# Patient Record
Sex: Female | Born: 1985 | Race: White | Hispanic: No | Marital: Single | Smoking: Current every day smoker
Health system: Southern US, Community
[De-identification: ages and names within clinical notes are randomized; demographics above are authoritative.]

## PROBLEM LIST (undated history)

## (undated) DIAGNOSIS — I10 Essential (primary) hypertension: Secondary | ICD-10-CM

---
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
## 2007-02-18 ENCOUNTER — Ambulatory Visit (HOSPITAL_COMMUNITY): Admission: RE | Admit: 2007-02-18 | Discharge: 2007-02-18 | Payer: Self-pay | Admitting: Internal Medicine

## 2008-09-20 ENCOUNTER — Ambulatory Visit (HOSPITAL_COMMUNITY): Admission: RE | Admit: 2008-09-20 | Discharge: 2008-09-20 | Payer: Self-pay | Admitting: Obstetrics

## 2008-12-12 ENCOUNTER — Ambulatory Visit (HOSPITAL_COMMUNITY): Admission: RE | Admit: 2008-12-12 | Discharge: 2008-12-12 | Payer: Self-pay | Admitting: Obstetrics

## 2009-02-05 ENCOUNTER — Encounter: Payer: Self-pay | Admitting: Obstetrics

## 2009-02-05 ENCOUNTER — Inpatient Hospital Stay (HOSPITAL_COMMUNITY): Admission: RE | Admit: 2009-02-05 | Discharge: 2009-02-08 | Payer: Self-pay | Admitting: Obstetrics

## 2010-05-19 LAB — CBC
Hemoglobin: 9.1 g/dL — ABNORMAL LOW (ref 12.0–15.0)
MCHC: 34.5 g/dL (ref 30.0–36.0)
MCV: 88.9 fL (ref 78.0–100.0)
Platelets: 260 10*3/uL (ref 150–400)
Platelets: 323 10*3/uL (ref 150–400)
RBC: 2.96 MIL/uL — ABNORMAL LOW (ref 3.87–5.11)
RDW: 13.6 % (ref 11.5–15.5)
WBC: 8.5 10*3/uL (ref 4.0–10.5)

## 2010-05-19 LAB — RPR: RPR Ser Ql: NONREACTIVE

## 2011-05-24 IMAGING — US US OB DETAIL+14 WK
3 series · 14 of 28 positions shown · non-contrast
Comparison: none

OBSTETRICAL ULTRASOUND:
 This ultrasound exam was performed in the [HOSPITAL] Ultrasound Department.  The OB US report was generated in the AS system, and faxed to the ordering physician.  This report is also available in [REDACTED] PACS.

[Series 1: us ob detail +14 wk · 0.19mm/px · 1 of 4 slices shown (1 of 3)]
[im 4/4]
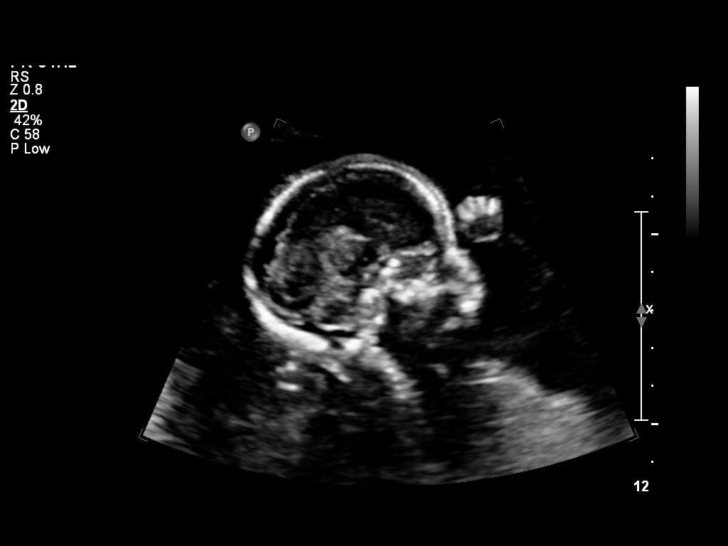

[Series 1: us ob detail +14 wk · 0.24mm/px · 11 of 43 slices shown (2 of 3)]
[im 3/43]
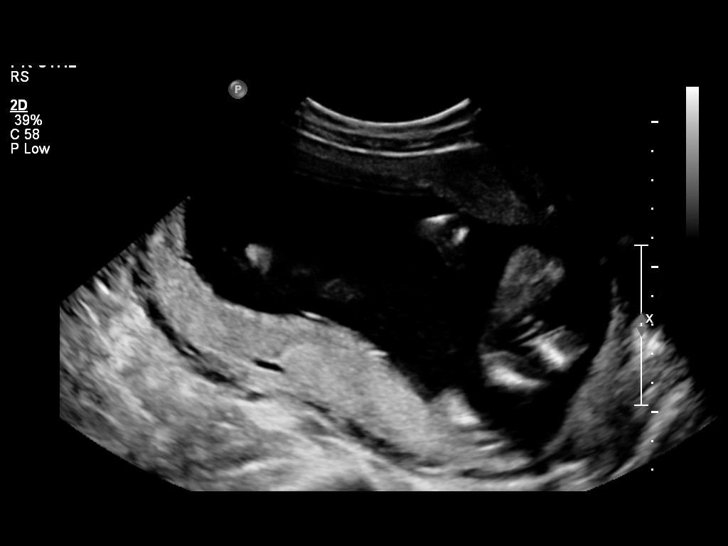
[im 7/43]
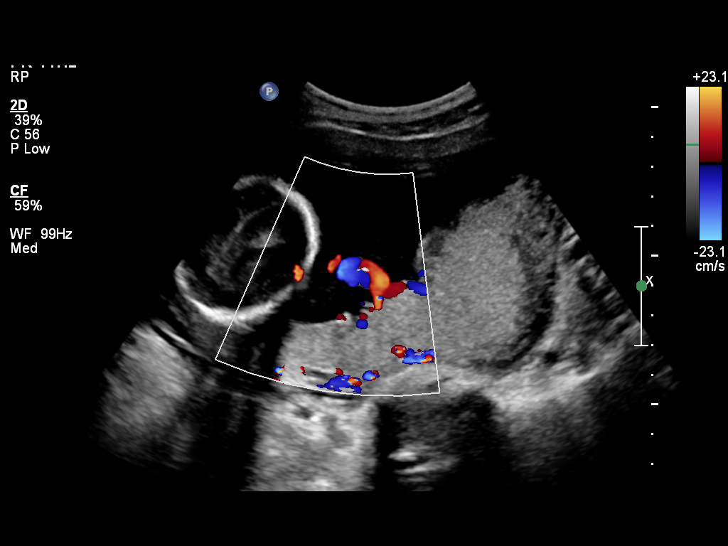
[im 11/43]
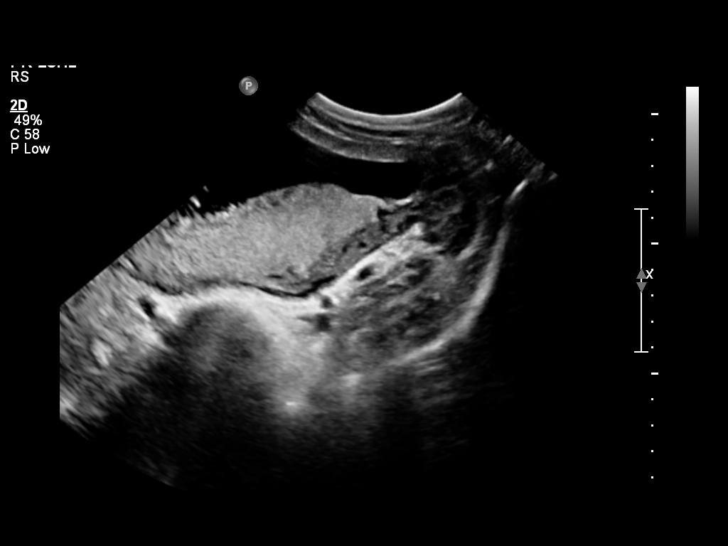
[im 15/43]
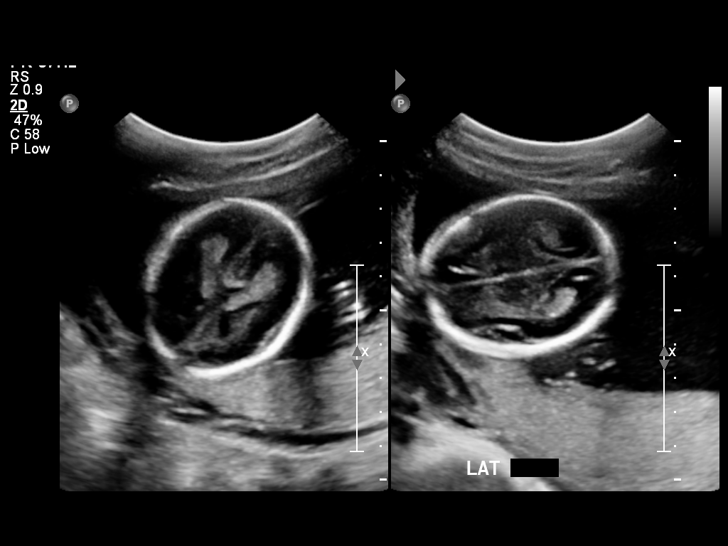
[im 19/43]
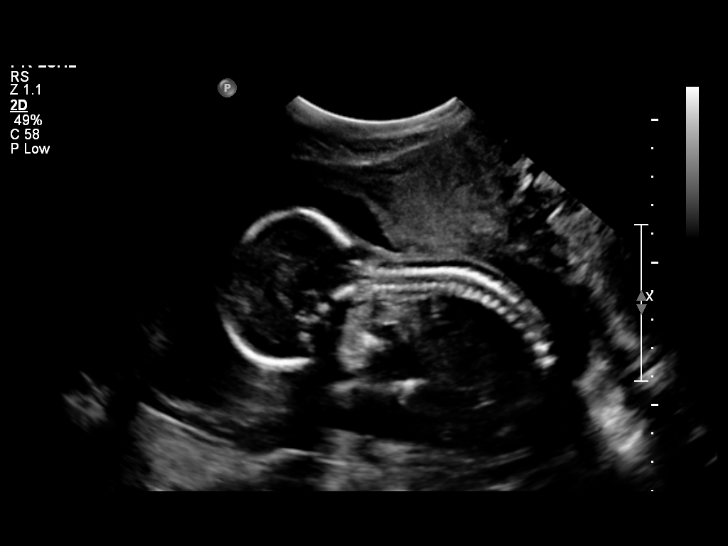
[im 23/43]
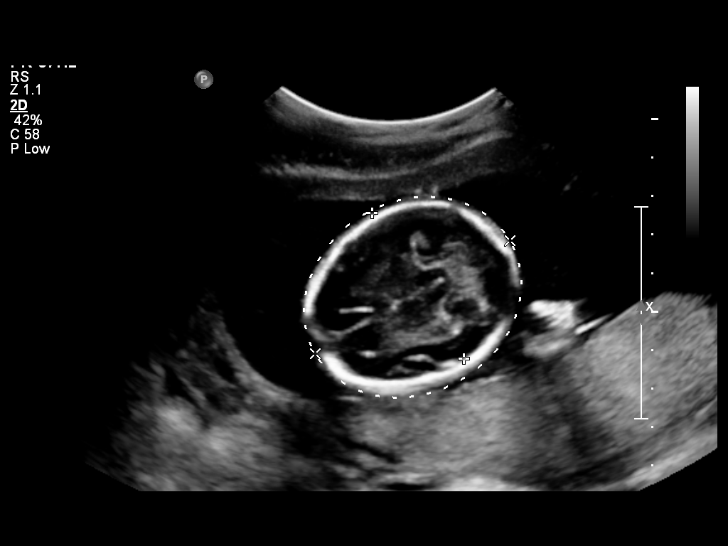
[im 27/43]
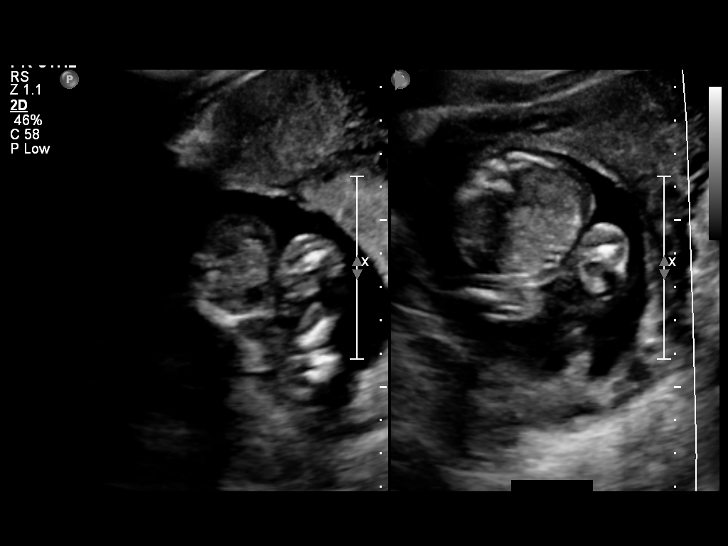
[im 31/43]
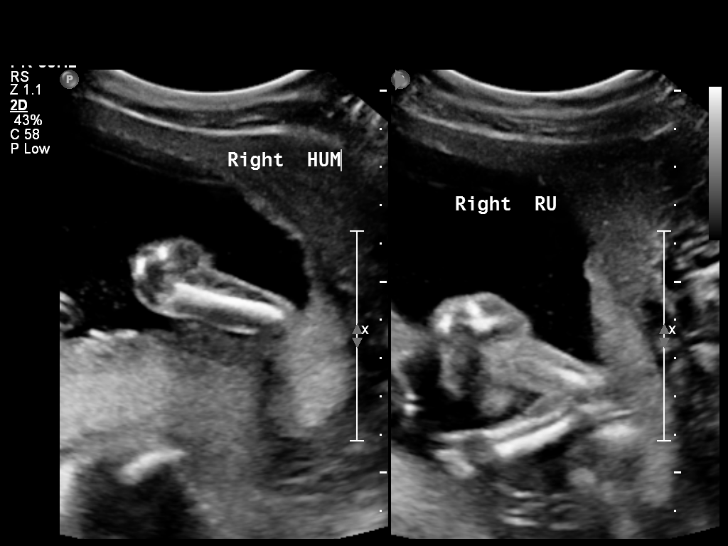
[im 35/43]
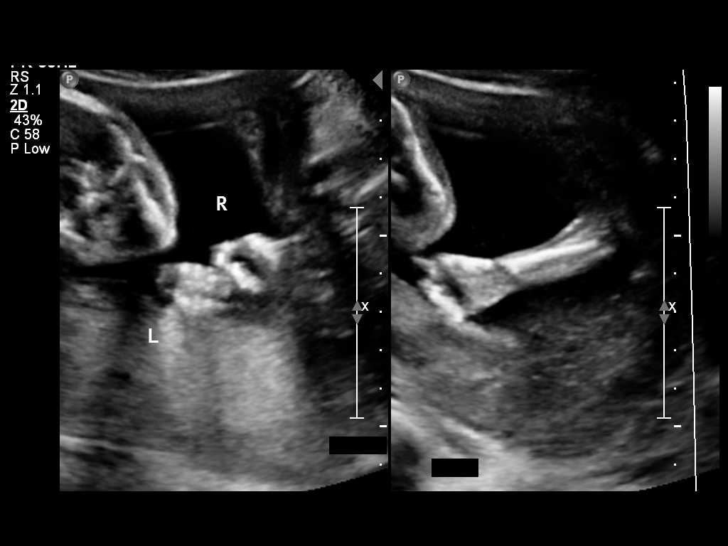
[im 39/43]
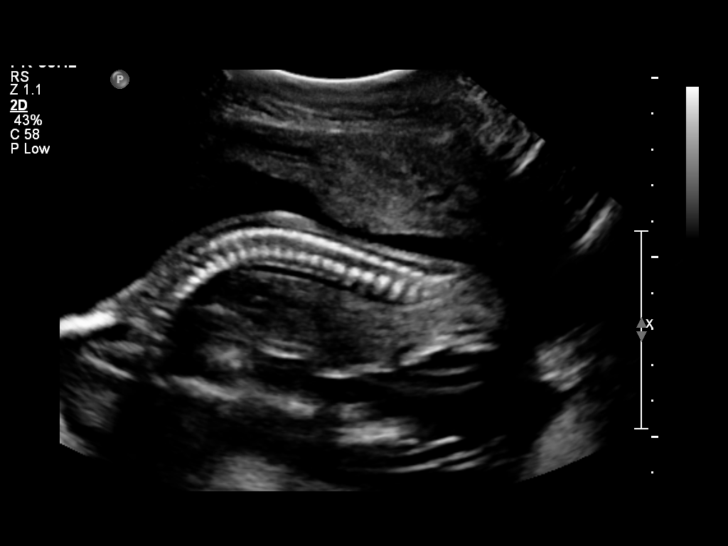
[im 43/43]
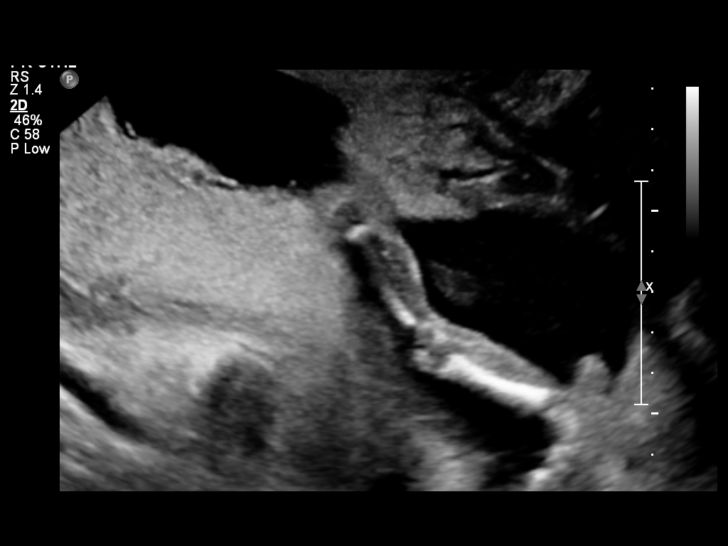

[Series 1: us ob detail +14 wk · 0.17mm/px · 2 of 9 slices shown (3 of 3)]
[im 3/9]
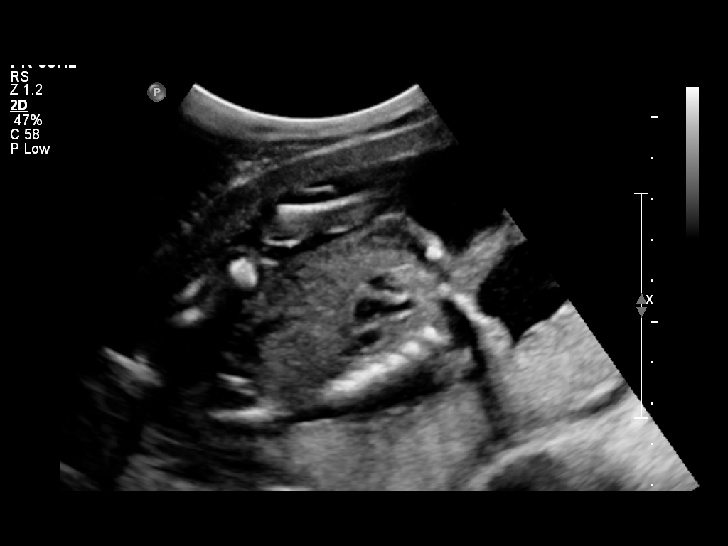
[im 9/9]
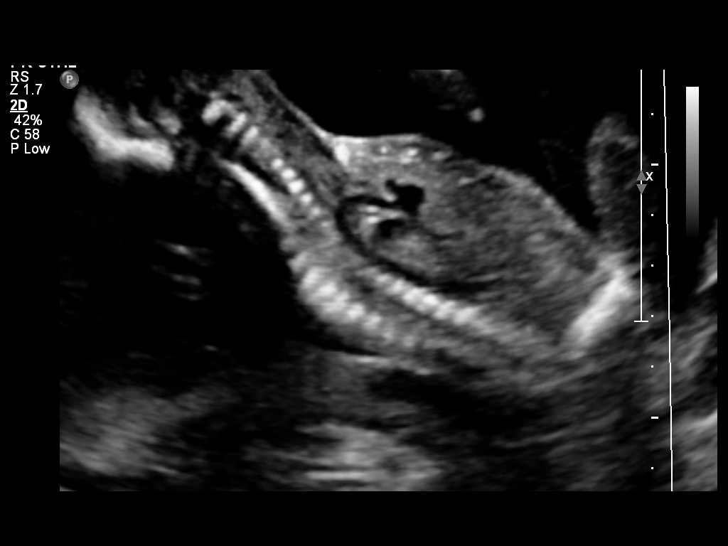

[14 of 28 positions shown; findings below may reference images not displayed]

IMPRESSION: See AS Obstetric US report.

## 2011-09-22 ENCOUNTER — Emergency Department (HOSPITAL_COMMUNITY)
Admission: EM | Admit: 2011-09-22 | Discharge: 2011-09-22 | Discharge status: Against Medical Advice | Payer: Medicaid Other | Attending: Emergency Medicine | Admitting: Emergency Medicine

## 2011-09-22 ENCOUNTER — Encounter (HOSPITAL_COMMUNITY): Payer: Self-pay | Admitting: *Deleted

## 2011-09-22 DIAGNOSIS — R1110 Vomiting, unspecified: Secondary | ICD-10-CM | POA: Insufficient documentation

## 2011-09-22 DIAGNOSIS — R109 Unspecified abdominal pain: Secondary | ICD-10-CM | POA: Insufficient documentation

## 2011-09-22 LAB — URINALYSIS, ROUTINE W REFLEX MICROSCOPIC
Glucose, UA: NEGATIVE mg/dL
Hgb urine dipstick: NEGATIVE
Protein, ur: NEGATIVE mg/dL
Specific Gravity, Urine: 1.027 (ref 1.005–1.030)
Urobilinogen, UA: 0.2 mg/dL (ref 0.0–1.0)
pH: 6 (ref 5.0–8.0)

## 2011-09-22 LAB — POCT PREGNANCY, URINE: Preg Test, Ur: NEGATIVE

## 2011-09-22 NOTE — ED Notes (Signed)
To ED for abd pain, belching, and vomiting. Pt states she has had this happen since she was little. States she usually just vomits for approx 12 hrs and then it resolves. No cp.

## 2011-09-22 NOTE — ED Notes (Signed)
The pt feel like she cannot wait any longer,.  She reports that she cannot stay here in the waiting room vomiting

## 2011-11-05 ENCOUNTER — Emergency Department (HOSPITAL_COMMUNITY)
Admission: EM | Admit: 2011-11-05 | Discharge: 2011-11-05 | Disposition: A | Discharge status: Home / Self Care | Payer: Medicaid Other | Attending: Emergency Medicine | Admitting: Emergency Medicine

## 2011-11-05 ENCOUNTER — Encounter (HOSPITAL_COMMUNITY): Payer: Self-pay | Admitting: *Deleted

## 2011-11-05 DIAGNOSIS — T24239A Burn of second degree of unspecified lower leg, initial encounter: Secondary | ICD-10-CM | POA: Insufficient documentation

## 2011-11-05 DIAGNOSIS — X19XXXA Contact with other heat and hot substances, initial encounter: Secondary | ICD-10-CM | POA: Insufficient documentation

## 2011-11-05 MED ORDER — BACITRACIN ZINC 500 UNIT/GM EX OINT
TOPICAL_OINTMENT | CUTANEOUS | Status: AC
  Filled 2011-11-05: qty 0.9

## 2011-11-05 MED ORDER — SILVER SULFADIAZINE 1 % EX CREA: TOPICAL_CREAM | Freq: Once | CUTANEOUS | Status: DC

## 2011-11-05 MED ORDER — BACITRACIN ZINC 500 UNIT/GM EX OINT
1.0000 "application " | TOPICAL_OINTMENT | Freq: Two times a day (BID) | CUTANEOUS | Status: DC
  Administered 2011-11-05: 1 via TOPICAL
  Filled 2011-11-05: qty 15
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
## 2011-11-05 NOTE — ED Notes (Signed)
Discharge instructions reviewed with pt, questions answered. Pt verbalized understanding.  
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
## 2011-11-05 NOTE — ED Notes (Signed)
Wound already cleaned, applied bacitracin and non stick bandage, provided pt instruction on wound care.

## 2011-11-05 NOTE — ED Provider Notes (Signed)
Medical screening examination/treatment/procedure(s) were performed by non-physician practitioner and as supervising physician I was immediately available for consultation/collaboration.  Derwood Kaplan, MD 11/05/11 2316

## 2012-03-11 ENCOUNTER — Encounter (HOSPITAL_COMMUNITY): Payer: Self-pay | Admitting: *Deleted

## 2012-03-11 ENCOUNTER — Emergency Department (HOSPITAL_COMMUNITY)
Admission: EM | Admit: 2012-03-11 | Discharge: 2012-03-11 | Disposition: A | Discharge status: Home / Self Care | Payer: MEDICAID | Attending: Emergency Medicine | Admitting: Emergency Medicine

## 2012-03-11 DIAGNOSIS — Z3202 Encounter for pregnancy test, result negative: Secondary | ICD-10-CM | POA: Insufficient documentation

## 2012-03-11 DIAGNOSIS — F17200 Nicotine dependence, unspecified, uncomplicated: Secondary | ICD-10-CM | POA: Insufficient documentation

## 2012-03-11 DIAGNOSIS — F1910 Other psychoactive substance abuse, uncomplicated: Secondary | ICD-10-CM

## 2012-03-11 LAB — CBC WITH DIFFERENTIAL/PLATELET
Basophils Relative: 1 % (ref 0–1)
Eosinophils Absolute: 0.1 10*3/uL (ref 0.0–0.7)
HCT: 39.7 % (ref 36.0–46.0)
Lymphocytes Relative: 32 % (ref 12–46)
Monocytes Absolute: 0.3 10*3/uL (ref 0.1–1.0)
Neutro Abs: 3.1 10*3/uL (ref 1.7–7.7)
RBC: 4.55 MIL/uL (ref 3.87–5.11)
RDW: 13.1 % (ref 11.5–15.5)

## 2012-03-11 LAB — URINALYSIS, ROUTINE W REFLEX MICROSCOPIC
Bilirubin Urine: NEGATIVE
Specific Gravity, Urine: 1.02 (ref 1.005–1.030)
Urobilinogen, UA: 0.2 mg/dL (ref 0.0–1.0)

## 2012-03-11 LAB — BASIC METABOLIC PANEL
BUN: 15 mg/dL (ref 6–23)
Chloride: 102 mEq/L (ref 96–112)
Creatinine, Ser: 0.72 mg/dL (ref 0.50–1.10)
GFR calc non Af Amer: >90 mL/min (ref >90)
Potassium: 3.9 mEq/L (ref 3.5–5.1)
Sodium: 140 mEq/L (ref 135–145)

## 2012-03-11 LAB — URINE MICROSCOPIC-ADD ON

## 2012-03-11 LAB — ETHANOL: Alcohol, Ethyl (B): <11 mg/dL (ref 0–11)

## 2012-03-11 LAB — RAPID URINE DRUG SCREEN, HOSP PERFORMED
Amphetamines: NOT DETECTED
Barbiturates: NOT DETECTED
Cocaine: NOT DETECTED

## 2012-03-11 NOTE — ED Notes (Addendum)
Pt states that she wants help to get off adderall , denies SI or HI, denies any attempt for suicide, pt also denies ETOH or drug use

## 2012-03-11 NOTE — ED Notes (Signed)
Family at bedside. 

## 2012-03-11 NOTE — ED Notes (Signed)
Pt has changed into paper scrubs and security called and to wand pt

## 2012-03-11 NOTE — ED Notes (Signed)
Pt left room, informed pt that waiting for d/c instructions, pt and pt's mother continued to leave without d/c papers

## 2012-03-11 NOTE — ED Provider Notes (Signed)
History   This chart was scribed for Flint Melter, MD, by Frederik Pear, ER scribe. The patient was seen in room APA16A/APA16A and the patient's care was started at 1520.    CSN: [id number]  Arrival date & time 03/11/12  1504   First MD Initiated Contact with Patient 03/11/12 1520      Chief Complaint  Patient presents with  . V70.1    (Consider location/radiation/quality/duration/timing/severity/associated sxs/prior treatment) HPI  Katherine Mendoza is a 27 y.o. female who presents to the Emergency Department stating that she wants helping getting treatment to stop taking adderall. She states that she feels committed to stopping, but has been unable to do so on her own. She reports that for the past 7 years that has used anywhere from 0 to 100 mg a day 5 out of 7 days of the week. She also reports that she occasionally uses marijuana and xanax once every couple of weeks. She denies any other current symptoms nausea or emesis. Her mother reports that typically gets violent, belligerent, and exhibits SI if she goes more than 3 days without taking it. When asked, she denies any current SI or having a plan. She denies having a current PCP, therapist or psychiatrist, but has an appointment scheduled for 02/27 after having been at [facility] today and yesterday. She denies any ETOH abuse. She reports her last menstrual cycle was at the beginning of Jan and that she currently is not pregnant.  History reviewed. No pertinent past medical history.  Past Surgical History  Procedure Date  . Cesarean section     History reviewed. No pertinent family history.  History  Substance Use Topics  . Smoking status: Current Every Day Smoker  . Smokeless tobacco: Not on file  . Alcohol Use: No    OB History    Grav Para Term Preterm Abortions TAB SAB Ect Mult Living                  Review of Systems A complete 10 system review of systems was obtained and all systems are negative except as  noted in the HPI and PMH.   Allergies  Penicillins and Sulfa antibiotics  Home Medications   Current Outpatient Rx  Name  Route  Sig  Dispense  Refill  . IBUPROFEN 200 MG PO TABS   Oral   Take 400 mg by mouth once as needed. For pain           BP 133/88  Pulse 101  Temp 99.1 F (37.3 C) (Oral)  Resp 18  Ht 5\' 6"  (1.676 m)  Wt 115 lb (52.164 kg)  BMI 18.56 kg/m2  SpO2 100%  LMP 02/17/2012  Physical Exam  Nursing note and vitals reviewed. Constitutional: She is oriented to person, place, and time. She appears well-developed and well-nourished.  HENT:  Head: Normocephalic and atraumatic.  Eyes: Conjunctivae normal and EOM are normal. Pupils are equal, round, and reactive to light.  Neck: Normal range of motion and phonation normal. Neck supple.  Cardiovascular: Normal rate, regular rhythm and intact distal pulses.   Pulmonary/Chest: Effort normal and breath sounds normal. She exhibits no tenderness.  Abdominal: Soft. She exhibits no distension. There is no tenderness. There is no guarding.  Musculoskeletal: Normal range of motion.  Neurological: She is alert and oriented to person, place, and time. She has normal strength. She exhibits normal muscle tone.  Skin: Skin is warm and dry.  Psychiatric: She has a normal mood and  affect. Her behavior is normal. Judgment and thought content normal.    ED Course  Procedures (including critical care time)  DIAGNOSTIC STUDIES: Oxygen Saturation is 100% on room air, normal by my interpretation.    COORDINATION OF CARE:  16:33- Discussed planned course of treatment with the patient, including a UA, blood work, and drug screen, who is agreeable at this time.  17:09- Recheck- Discussed with pt after speaking with Samson Frederic from [facility] that she does not qualify for inpatient treatment.  Results for orders placed during the hospital encounter of 03/11/12  URINALYSIS, ROUTINE W REFLEX MICROSCOPIC      Component Value Range   Color,  Urine YELLOW  YELLOW   APPearance CLEAR  CLEAR   Specific Gravity, Urine 1.020  1.005 - 1.030   pH 7.0  5.0 - 8.0   Glucose, UA NEGATIVE  NEGATIVE mg/dL   Hgb urine dipstick SMALL (*) NEGATIVE   Bilirubin Urine NEGATIVE  NEGATIVE   Ketones, ur NEGATIVE  NEGATIVE mg/dL   Protein, ur NEGATIVE  NEGATIVE mg/dL   Urobilinogen, UA 0.2  0.0 - 1.0 mg/dL   Nitrite NEGATIVE  NEGATIVE   Leukocytes, UA SMALL (*) NEGATIVE  PREGNANCY, URINE      Component Value Range   Preg Test, Ur NEGATIVE  NEGATIVE  URINE RAPID DRUG SCREEN (HOSP PERFORMED)      Component Value Range   Opiates NONE DETECTED  NONE DETECTED   Cocaine NONE DETECTED  NONE DETECTED   Benzodiazepines NONE DETECTED  NONE DETECTED   Amphetamines NONE DETECTED  NONE DETECTED   Tetrahydrocannabinol POSITIVE (*) NONE DETECTED   Barbiturates NONE DETECTED  NONE DETECTED  CBC WITH DIFFERENTIAL      Component Value Range   WBC 5.1  4.0 - 10.5 K/uL   RBC 4.55  3.87 - 5.11 MIL/uL   Hemoglobin 13.1  12.0 - 15.0 g/dL   HCT 16.1  09.6 - 04.5 %   MCV 87.3  78.0 - 100.0 fL   MCH 28.8  26.0 - 34.0 pg   MCHC 33.0  30.0 - 36.0 g/dL   RDW 40.9  81.1 - 91.4 %   Platelets 390  150 - 400 K/uL   Neutrophils Relative 61  43 - 77 %   Neutro Abs 3.1  1.7 - 7.7 K/uL   Lymphocytes Relative 32  12 - 46 %   Lymphs Abs 1.6  0.7 - 4.0 K/uL   Monocytes Relative 5  3 - 12 %   Monocytes Absolute 0.3  0.1 - 1.0 K/uL   Eosinophils Relative 1  0 - 5 %   Eosinophils Absolute 0.1  0.0 - 0.7 K/uL   Basophils Relative 1  0 - 1 %   Basophils Absolute 0.0  0.0 - 0.1 K/uL  BASIC METABOLIC PANEL      Component Value Range   Sodium 140  135 - 145 mEq/L   Potassium 3.9  3.5 - 5.1 mEq/L   Chloride 102  96 - 112 mEq/L   CO2 31  19 - 32 mEq/L   Glucose, Bld 80  70 - 99 mg/dL   BUN 15  6 - 23 mg/dL   Creatinine, Ser 7.82  0.50 - 1.10 mg/dL   Calcium 9.5  8.4 - 95.6 mg/dL   GFR calc non Af Amer >90  >90 mL/min   GFR calc Af Amer >90  >90 mL/min  ETHANOL       Component Value Range   Alcohol, Ethyl (  B) <11  0 - 11 mg/dL  URINE MICROSCOPIC-ADD ON      Component Value Range   Squamous Epithelial / LPF MANY (*) RARE   WBC, UA 3-6  <3 WBC/hpf   RBC / HPF 0-2  <3 RBC/hpf   Bacteria, UA MANY (*) RARE  URINE CULTURE      Component Value Range   Specimen Description URINE, CLEAN CATCH     Special Requests NONE     Culture  Setup Time 03/12/2012 01:45     Colony Count NO GROWTH     Culture NO GROWTH     Report Status 03/13/2012 FINAL     Labs Reviewed  URINALYSIS, ROUTINE W REFLEX MICROSCOPIC - Abnormal; Notable for the following:    Hgb urine dipstick SMALL (*)     Leukocytes, UA SMALL (*)     All other components within normal limits  URINE RAPID DRUG SCREEN (HOSP PERFORMED) - Abnormal; Notable for the following:    Tetrahydrocannabinol POSITIVE (*)     All other components within normal limits  URINE MICROSCOPIC-ADD ON - Abnormal; Notable for the following:    Squamous Epithelial / LPF MANY (*)     Bacteria, UA MANY (*)     All other components within normal limits  PREGNANCY, URINE  CBC WITH DIFFERENTIAL  BASIC METABOLIC PANEL  ETHANOL  URINE CULTURE   Nursing notes, applicable records and vitals reviewed.  Radiologic Images/Reports reviewed.    1. Substance abuse       MDM   Substance abuse. No indication for medical or psychiatric admission. She is stable for discharge         Plan: Home Medications- otc prn; Home Treatments- rest, avoid illegal drugs; Recommended follow up- OP substance abuse programs   I personally performed the services described in this documentation, which was scribed in my presence. The recorded information has been reviewed and is accurate.         Flint Melter, MD 03/14/12 [phone]

## 2012-03-11 NOTE — ED Notes (Signed)
Pt's belongings bagged and placed in cabinet, locked within the dept.

## 2012-03-11 NOTE — ED Notes (Signed)
MD at bedside. 

## 2012-03-11 NOTE — ED Notes (Signed)
Family at bedside. Samson Frederic called at this time by [facility] for DR. Early Osmond

## 2012-03-11 NOTE — ED Notes (Signed)
Wants help getting off adderall.  Went to [facility] yesterday.

## 2012-03-13 LAB — URINE CULTURE: Colony Count: NO GROWTH

## 2021-08-29 ENCOUNTER — Other Ambulatory Visit (HOSPITAL_COMMUNITY): Payer: Self-pay | Admitting: Sports Medicine

## 2021-08-29 DIAGNOSIS — Z1231 Encounter for screening mammogram for malignant neoplasm of breast: Secondary | ICD-10-CM

## 2022-02-16 NOTE — L&D Delivery Note (Signed)
OB/GYN Faculty Practice Delivery Note  Katherine Mendoza is a 37 y.o. [phone] s/p SVD at [redacted]w[redacted]d. She was admitted for precipitous delivery.   ROM: 0h 61m with thick meconium fluid GBS Status: Unknown   Maximum Maternal Temperature:  No data recorded.    Labor Progress: Patient arrived after precipitous delivery.  Delivery Date/Time: 09/03/2022 at Approximately 1457 Delivery: Patient delivered precipitously in the car on the way to the hospital.  On arrival infant in hands.  Placenta still in the uterus. Placenta delivered spontaneously with gentle cord traction.  Attempted to trial cord blood but was unsuccessful.  Fundus firm with massage and Pitocin. Labia, perineum, vagina, and cervix inspected with second-degree perineal laceration.   Placenta:  spontaneous, intact, 3 vessel cord  Complications: Precipitous delivery Lacerations: Second-degree perineal repaired EBL: 150 mL Analgesia: Lidocaine  Infant: APGAR (1 MIN):   APGAR (5 MINS):   APGAR (10 MINS):    Weight: Pending  Derrel Nip, MD  OB Fellow  09/03/2022 5:39 PM

## 2022-09-03 ENCOUNTER — Inpatient Hospital Stay (HOSPITAL_COMMUNITY)
Admission: AD | Admit: 2022-09-03 | Discharge: 2022-09-06 | DRG: 806 | Disposition: A | Discharge status: Home / Self Care | Payer: Medicaid Other | Attending: Obstetrics and Gynecology | Admitting: Obstetrics and Gynecology

## 2022-09-03 ENCOUNTER — Encounter (HOSPITAL_COMMUNITY): Payer: Self-pay | Admitting: Obstetrics & Gynecology

## 2022-09-03 DIAGNOSIS — O1092 Unspecified pre-existing hypertension complicating childbirth: Secondary | ICD-10-CM | POA: Diagnosis present

## 2022-09-03 DIAGNOSIS — O114 Pre-existing hypertension with pre-eclampsia, complicating childbirth: Secondary | ICD-10-CM | POA: Diagnosis present

## 2022-09-03 DIAGNOSIS — O1490 Unspecified pre-eclampsia, unspecified trimester: Secondary | ICD-10-CM | POA: Diagnosis present

## 2022-09-03 DIAGNOSIS — O34219 Maternal care for unspecified type scar from previous cesarean delivery: Secondary | ICD-10-CM | POA: Diagnosis present

## 2022-09-03 DIAGNOSIS — O99324 Drug use complicating childbirth: Secondary | ICD-10-CM

## 2022-09-03 DIAGNOSIS — F1920 Other psychoactive substance dependence, uncomplicated: Secondary | ICD-10-CM | POA: Diagnosis present

## 2022-09-03 DIAGNOSIS — O0933 Supervision of pregnancy with insufficient antenatal care, third trimester: Secondary | ICD-10-CM

## 2022-09-03 DIAGNOSIS — O99334 Smoking (tobacco) complicating childbirth: Secondary | ICD-10-CM | POA: Diagnosis present

## 2022-09-03 DIAGNOSIS — O0930 Supervision of pregnancy with insufficient antenatal care, unspecified trimester: Secondary | ICD-10-CM

## 2022-09-03 DIAGNOSIS — Z3A38 38 weeks gestation of pregnancy: Secondary | ICD-10-CM

## 2022-09-03 DIAGNOSIS — Z79899 Other long term (current) drug therapy: Secondary | ICD-10-CM | POA: Diagnosis not present

## 2022-09-03 DIAGNOSIS — O34211 Maternal care for low transverse scar from previous cesarean delivery: Secondary | ICD-10-CM

## 2022-09-03 DIAGNOSIS — O1414 Severe pre-eclampsia complicating childbirth: Secondary | ICD-10-CM

## 2022-09-03 HISTORY — DX: Essential (primary) hypertension: I10

## 2022-09-03 LAB — COMPREHENSIVE METABOLIC PANEL
ALT: 10 U/L (ref 0–44)
AST: 17 U/L (ref 15–41)
Albumin: 2.8 g/dL — ABNORMAL LOW (ref 3.5–5.0)
Alkaline Phosphatase: 190 U/L — ABNORMAL HIGH (ref 38–126)
Anion gap: 10 (ref 5–15)
BUN: 5 mg/dL — ABNORMAL LOW (ref 6–20)
CO2: 21 mmol/L — ABNORMAL LOW (ref 22–32)
Calcium: 8.5 mg/dL — ABNORMAL LOW (ref 8.9–10.3)
Chloride: 99 mmol/L (ref 98–111)
Creatinine, Ser: 0.5 mg/dL (ref 0.44–1.00)
GFR, Estimated: >60 mL/min (ref >60)
Glucose, Bld: 105 mg/dL — ABNORMAL HIGH (ref 70–99)
Potassium: 3.9 mmol/L (ref 3.5–5.1)
Sodium: 130 mmol/L — ABNORMAL LOW (ref 135–145)
Total Bilirubin: 0.4 mg/dL (ref 0.3–1.2)
Total Protein: 6.4 g/dL — ABNORMAL LOW (ref 6.5–8.1)

## 2022-09-03 LAB — CBC
HCT: 34 % — ABNORMAL LOW (ref 36.0–46.0)
Hemoglobin: 11.2 g/dL — ABNORMAL LOW (ref 12.0–15.0)
MCH: 28.3 pg (ref 26.0–34.0)
MCHC: 32.9 g/dL (ref 30.0–36.0)
MCV: 85.9 fL (ref 80.0–100.0)
Platelets: 317 10*3/uL (ref 150–400)
RBC: 3.96 MIL/uL (ref 3.87–5.11)
RDW: 13.3 % (ref 11.5–15.5)
WBC: 13.8 10*3/uL — ABNORMAL HIGH (ref 4.0–10.5)
nRBC: 0 % (ref 0.0–0.2)

## 2022-09-03 LAB — TYPE AND SCREEN
ABO/RH(D): A POS
Antibody Screen: NEGATIVE

## 2022-09-03 LAB — HEPATITIS B SURFACE ANTIGEN: Hepatitis B Surface Ag: NONREACTIVE

## 2022-09-03 LAB — HIV ANTIBODY (ROUTINE TESTING W REFLEX): HIV Screen 4th Generation wRfx: NONREACTIVE

## 2022-09-03 MED ORDER — SIMETHICONE 80 MG PO CHEW: 80.0000 mg | CHEWABLE_TABLET | ORAL | Status: DC | PRN

## 2022-09-03 MED ORDER — BENZOCAINE-MENTHOL 20-0.5 % EX AERO: 1.0000 | INHALATION_SPRAY | CUTANEOUS | Status: DC | PRN

## 2022-09-03 MED ORDER — ONDANSETRON HCL 4 MG/2ML IJ SOLN: 4.0000 mg | Freq: Four times a day (QID) | INTRAMUSCULAR | Status: DC | PRN

## 2022-09-03 MED ORDER — TETANUS-DIPHTH-ACELL PERTUSSIS 5-2.5-18.5 LF-MCG/0.5 IM SUSY
0.5000 mL | PREFILLED_SYRINGE | Freq: Once | INTRAMUSCULAR | Status: DC
  Filled 2022-09-03: qty 0.5

## 2022-09-03 MED ORDER — SOD CITRATE-CITRIC ACID 500-334 MG/5ML PO SOLN: 30.0000 mL | ORAL | Status: DC | PRN

## 2022-09-03 MED ORDER — DIPHENHYDRAMINE HCL 25 MG PO CAPS: 25.0000 mg | ORAL_CAPSULE | Freq: Four times a day (QID) | ORAL | Status: DC | PRN

## 2022-09-03 MED ORDER — NIFEDIPINE ER OSMOTIC RELEASE 30 MG PO TB24
30.0000 mg | ORAL_TABLET | Freq: Two times a day (BID) | ORAL | Status: DC
  Administered 2022-09-03 – 2022-09-06 (×5): 30 mg via ORAL
  Filled 2022-09-03 (×6): qty 1

## 2022-09-03 MED ORDER — COCONUT OIL OIL: 1.0000 | TOPICAL_OIL | Status: DC | PRN

## 2022-09-03 MED ORDER — OXYTOCIN-SODIUM CHLORIDE 30-0.9 UT/500ML-% IV SOLN
INTRAVENOUS | Status: AC
  Filled 2022-09-03: qty 500

## 2022-09-03 MED ORDER — MAGNESIUM SULFATE 40 GM/1000ML IV SOLN
2.0000 g/h | INTRAVENOUS | Status: AC
  Administered 2022-09-04: 2 g/h via INTRAVENOUS
  Filled 2022-09-03: qty 1000

## 2022-09-03 MED ORDER — FUROSEMIDE 20 MG PO TABS
20.0000 mg | ORAL_TABLET | Freq: Two times a day (BID) | ORAL | Status: DC
  Administered 2022-09-03 – 2022-09-06 (×6): 20 mg via ORAL
  Filled 2022-09-03 (×8): qty 1

## 2022-09-03 MED ORDER — ONDANSETRON HCL 4 MG PO TABS: 4.0000 mg | ORAL_TABLET | ORAL | Status: DC | PRN

## 2022-09-03 MED ORDER — LIDOCAINE HCL (PF) 1 % IJ SOLN
INTRAMUSCULAR | Status: AC
  Administered 2022-09-03: 30 mL
  Filled 2022-09-03: qty 30

## 2022-09-03 MED ORDER — ZOLPIDEM TARTRATE 5 MG PO TABS: 5.0000 mg | ORAL_TABLET | Freq: Every evening | ORAL | Status: DC | PRN

## 2022-09-03 MED ORDER — OXYTOCIN-SODIUM CHLORIDE 30-0.9 UT/500ML-% IV SOLN: 2.5000 [IU]/h | INTRAVENOUS | Status: DC

## 2022-09-03 MED ORDER — SENNOSIDES-DOCUSATE SODIUM 8.6-50 MG PO TABS
2.0000 | ORAL_TABLET | ORAL | Status: DC
  Administered 2022-09-04 – 2022-09-06 (×3): 2 via ORAL
  Filled 2022-09-03 (×3): qty 2

## 2022-09-03 MED ORDER — LABETALOL HCL 5 MG/ML IV SOLN: 80.0000 mg | INTRAVENOUS | Status: DC | PRN

## 2022-09-03 MED ORDER — LACTATED RINGERS IV SOLN: INTRAVENOUS | Status: DC

## 2022-09-03 MED ORDER — ACETAMINOPHEN 325 MG PO TABS
650.0000 mg | ORAL_TABLET | ORAL | Status: DC | PRN
  Administered 2022-09-03: 650 mg via ORAL
  Filled 2022-09-03 (×2): qty 2

## 2022-09-03 MED ORDER — OXYTOCIN BOLUS FROM INFUSION
333.0000 mL | Freq: Once | INTRAVENOUS | Status: AC
  Administered 2022-09-03: 333 mL via INTRAVENOUS

## 2022-09-03 MED ORDER — ONDANSETRON HCL 4 MG/2ML IJ SOLN: 4.0000 mg | INTRAMUSCULAR | Status: DC | PRN

## 2022-09-03 MED ORDER — DIBUCAINE (PERIANAL) 1 % EX OINT: 1.0000 | TOPICAL_OINTMENT | CUTANEOUS | Status: DC | PRN

## 2022-09-03 MED ORDER — BUPRENORPHINE HCL-NALOXONE HCL 2-0.5 MG SL SUBL
0.5000 | SUBLINGUAL_TABLET | Freq: Every day | SUBLINGUAL | Status: DC
  Administered 2022-09-04 – 2022-09-05 (×2): 0.5 via SUBLINGUAL
  Filled 2022-09-03 (×2): qty 1

## 2022-09-03 MED ORDER — LABETALOL HCL 5 MG/ML IV SOLN
INTRAVENOUS | Status: AC
  Filled 2022-09-03: qty 8

## 2022-09-03 MED ORDER — LABETALOL HCL 5 MG/ML IV SOLN
40.0000 mg | INTRAVENOUS | Status: DC | PRN
  Administered 2022-09-03: 40 mg via INTRAVENOUS

## 2022-09-03 MED ORDER — MAGNESIUM SULFATE 40 GM/1000ML IV SOLN
INTRAVENOUS | Status: AC
  Filled 2022-09-03: qty 1000

## 2022-09-03 MED ORDER — IBUPROFEN 600 MG PO TABS
600.0000 mg | ORAL_TABLET | Freq: Four times a day (QID) | ORAL | Status: DC
  Administered 2022-09-03 – 2022-09-06 (×12): 600 mg via ORAL
  Filled 2022-09-03 (×11): qty 1

## 2022-09-03 MED ORDER — PRENATAL MULTIVITAMIN CH
1.0000 | ORAL_TABLET | Freq: Every day | ORAL | Status: DC
  Administered 2022-09-05 – 2022-09-06 (×2): 1 via ORAL
  Filled 2022-09-03 (×2): qty 1

## 2022-09-03 MED ORDER — MAGNESIUM SULFATE BOLUS VIA INFUSION
4.0000 g | Freq: Once | INTRAVENOUS | Status: AC
  Administered 2022-09-03: 4 g via INTRAVENOUS

## 2022-09-03 MED ORDER — LIDOCAINE HCL (PF) 1 % IJ SOLN: 30.0000 mL | INTRAMUSCULAR | Status: DC | PRN

## 2022-09-03 MED ORDER — ACETAMINOPHEN 325 MG PO TABS: 650.0000 mg | ORAL_TABLET | ORAL | Status: DC | PRN

## 2022-09-03 MED ORDER — ERYTHROMYCIN 5 MG/GM OP OINT
TOPICAL_OINTMENT | OPHTHALMIC | Status: AC
  Filled 2022-09-03: qty 1

## 2022-09-03 MED ORDER — WITCH HAZEL-GLYCERIN EX PADS: 1.0000 | MEDICATED_PAD | CUTANEOUS | Status: DC | PRN

## 2022-09-03 MED ORDER — HYDRALAZINE HCL 20 MG/ML IJ SOLN: 10.0000 mg | INTRAMUSCULAR | Status: DC | PRN

## 2022-09-03 MED ORDER — OXYCODONE-ACETAMINOPHEN 5-325 MG PO TABS: 2.0000 | ORAL_TABLET | ORAL | Status: DC | PRN

## 2022-09-03 MED ORDER — LABETALOL HCL 5 MG/ML IV SOLN
20.0000 mg | INTRAVENOUS | Status: DC | PRN
  Administered 2022-09-03: 20 mg via INTRAVENOUS

## 2022-09-03 MED ORDER — OXYCODONE-ACETAMINOPHEN 5-325 MG PO TABS: 1.0000 | ORAL_TABLET | ORAL | Status: DC | PRN

## 2022-09-03 MED ORDER — LABETALOL HCL 5 MG/ML IV SOLN
INTRAVENOUS | Status: AC
  Filled 2022-09-03: qty 4

## 2022-09-03 MED ORDER — LACTATED RINGERS IV SOLN: 500.0000 mL | INTRAVENOUS | Status: DC | PRN

## 2022-09-03 NOTE — Progress Notes (Signed)
Repair in progress per Dr Bartholomew Boards.

## 2022-09-03 NOTE — H&P (Signed)
OBSTETRIC ADMISSION HISTORY AND PHYSICAL  LOISANN ROACH is a 37 y.o. female [phone] with IUP at [redacted]w[redacted]d by LMP presenting for precipitous delivery. She reports +FMs, No LOF, no VB, no blurry vision, headaches or peripheral edema, and RUQ pain.  She plans on breast feeding. She request nothing for birth control. She received her prenatal care at  [facility]    Dating: By LMP --->  Estimated Date of Delivery: 09/17/22  Sono:  None done   Prenatal History/Complications: No prenatal care, history of preeclampsia, history of CS x 2  Past Medical History: Past Medical History:  Diagnosis Date   Hypertension     Past Surgical History: Past Surgical History:  Procedure Laterality Date   CESAREAN SECTION      Obstetrical History: OB History     Gravida  4   Para  3   Term  2   Preterm  1   AB      Living  3      SAB      IAB      Ectopic      Multiple  0   Live Births  3           Social History Social History   Socioeconomic History   Marital status: Single    Spouse name: Not on file   Number of children: Not on file   Years of education: Not on file   Highest education level: Not on file  Occupational History   Not on file  Tobacco Use   Smoking status: Every Day   Smokeless tobacco: Not on file  Substance and Sexual Activity   Alcohol use: No   Drug use: Yes    Types: Marijuana   Sexual activity: Yes    Birth control/protection: None  Other Topics Concern   Not on file  Social History Narrative   Not on file   Social Determinants of Health   Financial Resource Strain: Not on file  Food Insecurity: Not on file  Transportation Needs: Not on file  Physical Activity: Not on file  Stress: Not on file  Social Connections: Not on file    Family History: No family history on file.  Allergies: Allergies  Allergen Reactions   Penicillins Other (See Comments)    Doesn't remember    Sulfa Antibiotics Other (See Comments)    Doesn't  remember    Medications Prior to Admission  Medication Sig Dispense Refill Last Dose   ibuprofen (ADVIL,MOTRIN) 200 MG tablet Take 400 mg by mouth once as needed. For pain        Review of Systems   All systems reviewed and negative except as stated in HPI  Blood pressure 139/83, pulse 81, resp. rate 18, height 5\' 6"  (1.676 m), weight 65.8 kg, unknown if currently breastfeeding. General appearance: alert, cooperative, and appears stated age Lungs: Normal work of breathing Heart: regular rate  Abdomen: soft, non-tender; bowel sounds normal Pelvic: Second-degree perineal laceration repaired Extremities: Homans sign is negative, no sign of DVT DTR's 2 beats of clonus   Prenatal labs: ABO, Rh: --/--/PENDING (07/18 1640) Antibody: PENDING (07/18 1640) Rubella:   RPR:    HBsAg:    HIV:    GBS:    1 hr Glucola not done Genetic screening not done Anatomy US not done  Prenatal Transfer Tool  No prenatal care  Results for orders placed or performed during the hospital encounter of 09/03/22 (from the past 24 hour(s))  Type and screen [facility]   Collection Time: 09/03/22  4:40 PM  Result Value Ref Range   ABO/RH(D) PENDING    Antibody Screen PENDING    Sample Expiration      09/06/2022,2359 Performed at [facility], [address], [place], [place] [address]     Patient Active Problem List   Diagnosis Date Noted   Indication for care in labor or delivery 09/03/2022   Preeclampsia 09/03/2022    Assessment/Plan:  SEKAI NAYAK is a 37 y.o. [id number] at [redacted]w[redacted]d here for precipitous delivery  #Labor: Patient reports that she progressed at home.  Was driving to the hospital and had to pull over on the side of the road to deliver baby.  [facility] patrol first on the scene.  Reports that she went to 1 previous prenatal appointment at [facility] health care but has not been seen since. #MOF: Breast #MOC: Declines Preeclampsia: Severe based  off of blood pressures on arrival.  Magnesium started.  Labetalol protocol.  Procardia 30 mg twice daily and Lasix 20 mg daily. No prenatal care:  Social work consult, UDS  Celedonio Savage, MD  09/03/2022, 5:22 PM

## 2022-09-03 NOTE — Discharge Summary (Signed)
Postpartum Discharge Summary  Date of Service updated: 09/07/22     Patient Name: Katherine Mendoza DOB: 1985/03/04 MRN: [id number]  Date of admission: 09/03/2022 Delivery date:09/03/2022 Delivering provider:   Date of discharge: 09/06/2022  Admitting diagnosis: Indication for care in labor or delivery [O75.9] Preeclampsia [O14.90] Intrauterine pregnancy: [redacted]w[redacted]d     Secondary diagnosis:  Principal Problem:   Indication for care in labor or delivery Active Problems:   Preeclampsia   No prenatal care in current pregnancy   Vaginal delivery   Precipitous delivery  Additional problems: limited prenatal care, substance dependence maintenance.    Discharge diagnosis: Term Pregnancy Delivered and CHTN with superimposed preeclampsia                                              Post partum procedures: n/a Augmentation: N/A Complications: None  Hospital course: Onset of Labor With Vaginal Delivery      37 y.o. yo [phone] at [redacted]w[redacted]d was admitted on 09/03/2022 after precipitous delivery and route to the hospital.. Labor course was complicated by precipitous delivery on the side of the road.  Placenta remained in until she arrived to the hospital.  Delivered placenta, large clot suspicious for abruption.  Patient also had severe range elevated blood pressures.  Started on labetalol protocol and magnesium. Membrane Rupture Time/Date: 2:15 PM,09/03/2022  Delivery Method:Vaginal, Spontaneous Episiotomy: None Lacerations:  2nd degree;Perineal;Periurethral Patient had a postpartum course complicated by continued hypertension.  She is ambulating, tolerating a regular diet, passing flatus, and urinating well. Patient is discharged home in stable condition on 09/07/22.  Newborn Data: Birth date:09/03/2022 Birth time:2:57 PM Gender:Female Living status:Living Apgars: ,  Weight:2270 g  Magnesium Sulfate received: Yes: Seizure prophylaxis BMZ received:  No Rhophylac:No MMR:No T-DaP:unknown Flu: No Transfusion:No  Physical exam  Vitals:   09/05/22 2321 09/06/22 0830 09/06/22 0835 09/06/22 0937  BP: 128/78 (!) 171/109 (!) 158/102 (!) 146/97  Pulse: 86 (!) 116 (!) 107 (!) 104  Resp: 17  17   Temp: 97.7 F (36.5 C)  98.6 F (37 C)   TempSrc: Oral  Oral   SpO2: 99%  99%   Weight:      Height:       General: alert, cooperative, and no distress Lochia: appropriate Uterine Fundus: firm Incision: N/A DVT Evaluation: No evidence of DVT seen on physical exam. No significant calf/ankle edema. Labs: Lab Results  Component Value Date   WBC 13.8 (H) 09/03/2022   HGB 11.2 (L) 09/03/2022   HCT 34.0 (L) 09/03/2022   MCV 85.9 09/03/2022   PLT 317 09/03/2022      Latest Ref Rng & Units 09/03/2022    4:40 PM  CMP  Glucose 70 - 99 mg/dL 433   BUN 6 - 20 mg/dL 5   Creatinine 2.95 - 1.88 mg/dL 4.16   Sodium 606 - 301 mmol/L 130   Potassium 3.5 - 5.1 mmol/L 3.9   Chloride 98 - 111 mmol/L 99   CO2 22 - 32 mmol/L 21   Calcium 8.9 - 10.3 mg/dL 8.5   Total Protein 6.5 - 8.1 g/dL 6.4   Total Bilirubin 0.3 - 1.2 mg/dL 0.4   Alkaline Phos 38 - 126 U/L 190   AST 15 - 41 U/L 17   ALT 0 - 44 U/L 10    Edinburgh Score:    09/06/2022  8:00 AM  Edinburgh Postnatal Depression Scale Screening Tool  I have been able to laugh and see the funny side of things. 0  I have looked forward with enjoyment to things. 0  I have blamed myself unnecessarily when things went wrong. 0  I have been anxious or worried for no good reason. 0  I have felt scared or panicky for no good reason. 0  Things have been getting on top of me. 0  I have been so unhappy that I have had difficulty sleeping. 0  I have felt sad or miserable. 0  I have been so unhappy that I have been crying. 0  The thought of harming myself has occurred to me. 0  Edinburgh Postnatal Depression Scale Total 0     After visit meds:  Allergies as of 09/06/2022       Reactions    Penicillins Other (See Comments)   Doesn't remember    Sulfa Antibiotics Other (See Comments)   Doesn't remember        Medication List     TAKE these medications    buprenorphine-naloxone 2-0.5 mg Subl SL tablet Commonly known as: SUBOXONE Place 1 tablet under the tongue daily.   furosemide 20 MG tablet Commonly known as: LASIX Take 1 tablet (20 mg total) by mouth daily.   ibuprofen 600 MG tablet Commonly known as: ADVIL Take 1 tablet (600 mg total) by mouth every 6 (six) hours as needed for moderate pain. What changed:  medication strength how much to take when to take this reasons to take this additional instructions   NIFEdipine 60 MG 24 hr tablet Commonly known as: ADALAT CC Take 1 tablet (60 mg total) by mouth 2 (two) times daily.         Discharge home in stable condition Infant Feeding: Breast Infant Disposition:rooming in Discharge instruction: per After Visit Summary and Postpartum booklet. Activity: Advance as tolerated. Pelvic rest for 6 weeks.  Diet: routine diet Future Appointments:No future appointments. Follow up Visit:  Follow-up Information     [facility] for [facility] at [facility]. Schedule an appointment as soon as possible for a visit in 1 week(s).   Specialty: Obstetrics and Gynecology Why: BP check Contact information: [address] [place] [address] [phone]        [facility] for [facility] at [facility]. Schedule an appointment as soon as possible for a visit in 1 month(s).   Specialty: Obstetrics and Gynecology Why: postpartum exam Contact information: [address] [place] [address] [phone]               Message sent  Please schedule this patient for a In person postpartum visit in 4 weeks with the following provider: Any provider. Additional Postpartum F/U:BP check 1 week  High risk pregnancy complicated by:  HTN Delivery mode:  Vaginal, Spontaneous Anticipated Birth Control:   Declines   09/06/2022 Warden Fillers, MD

## 2022-09-04 NOTE — Progress Notes (Signed)
POSTPARTUM PROGRESS NOTE  PPD #1  Subjective:  Katherine Mendoza is a 37 y.o. [phone] s/p VBAC at [redacted]w[redacted]d. Today she notes no acute complaints or concerns. She denies any problems with ambulating, voiding or po intake. Denies nausea or vomiting. She has passed flatus, no BM.  Pain is well controlled.  Lochia minimal Denies fever/chills/chest pain/SOB.  no HA, no blurry vision, no RUQ pain  Objective: Blood pressure 114/74, pulse 74, temperature 97.8 F (36.6 C), temperature source Oral, resp. rate 18, height 5\' 6"  (1.676 m), weight 65.8 kg, SpO2 99%, unknown if currently breastfeeding.  Physical Exam:  General: alert, cooperative and no distress Chest: no respiratory distress, CTAB Heart: regular rate and rhythm Abdomen: soft, nontender Uterine Fundus: firm, appropriately tender DVT Evaluation: No calf swelling or tenderness Extremities: no edema Skin: warm, dry  Results for orders placed or performed during the hospital encounter of 09/03/22 (from the past 24 hour(s))  Type and screen [facility]     Status: None   Collection Time: 09/03/22  4:36 PM  Result Value Ref Range   ABO/RH(D) A POS    Antibody Screen NEG    Sample Expiration      09/06/2022,2359 Performed at [facility], [address], [place], [place] [address]   HIV Antibody (routine testing w rflx)     Status: None   Collection Time: 09/03/22  4:40 PM  Result Value Ref Range   HIV Screen 4th Generation wRfx Non Reactive Non Reactive  CBC     Status: Abnormal   Collection Time: 09/03/22  4:40 PM  Result Value Ref Range   WBC 13.8 (H) 4.0 - 10.5 K/uL   RBC 3.96 3.87 - 5.11 MIL/uL   Hemoglobin 11.2 (L) 12.0 - 15.0 g/dL   HCT 65.7 (L) 84.6 - 96.2 %   MCV 85.9 80.0 - 100.0 fL   MCH 28.3 26.0 - 34.0 pg   MCHC 32.9 30.0 - 36.0 g/dL   RDW 95.2 84.1 - 32.4 %   Platelets 317 150 - 400 K/uL   nRBC 0.0 0.0 - 0.2 %  Comprehensive metabolic panel     Status: Abnormal   Collection Time:  09/03/22  4:40 PM  Result Value Ref Range   Sodium 130 (L) 135 - 145 mmol/L   Potassium 3.9 3.5 - 5.1 mmol/L   Chloride 99 98 - 111 mmol/L   CO2 21 (L) 22 - 32 mmol/L   Glucose, Bld 105 (H) 70 - 99 mg/dL   BUN 5 (L) 6 - 20 mg/dL   Creatinine, Ser 4.01 0.44 - 1.00 mg/dL   Calcium 8.5 (L) 8.9 - 10.3 mg/dL   Total Protein 6.4 (L) 6.5 - 8.1 g/dL   Albumin 2.8 (L) 3.5 - 5.0 g/dL   AST 17 15 - 41 U/L   ALT 10 0 - 44 U/L   Alkaline Phosphatase 190 (H) 38 - 126 U/L   Total Bilirubin 0.4 0.3 - 1.2 mg/dL   GFR, Estimated >02 >72 mL/min   Anion gap 10 5 - 15  Hepatitis B surface antigen     Status: None   Collection Time: 09/03/22  4:48 PM  Result Value Ref Range   Hepatitis B Surface Ag NON REACTIVE NON REACTIVE    Assessment/Plan: Katherine Mendoza is a 37 y.o. [phone] s/p VBAC at [redacted]w[redacted]d PPD#1 complicated by: 1) Preeclampsia with severe features -currently on Mag, plan to discontinue this afternoon -continue Lasix x 5 days -no  BP meds at this time, will continue to closely monitor  2) continue routine postpartum care  Contraception: undecided Feeding: breastfeeding  Dispo: Care as outlined above, plan for discharge home tomorrow   LOS: 1 day   Myna Hidalgo, DO Faculty Attending, Center for [facility] 09/04/2022, 7:20 AM

## 2022-09-04 NOTE — Lactation Note (Addendum)
This note was copied from a baby's chart. Lactation Consultation Note  Patient Name: Katherine Mendoza [id number] Date: 09/04/2022 Age:37 hours Reason for consult: Initial assessment;Early term 37-38.6wks;Infant < 6lbs  P4, Baby 38 week and < 6 lbs. Mother taking subutex. Mother is experienced with breastfeeding. Briefly spoke with mother about pumping and supplementing with her milk or donor milk.  Mother was getting ready to take a nap so LC discussed calling for lactation assistance later.   Returned to room and offered to set up DEBP, provided education of feeding behaviors for small infants and mother stated that with her last child who was in the NICU she did not like pumping and she stopped after two weeks.  She would prefer to breastfeed exclusively and would like to wait to start supplementing.  Discussed limiting feedings to 30 min to not overtire baby.  Infant recently breastfed for 30 min and is sleeping. Mother does have DEBP at home and will call if assistance if needed.  Suggested to mother to watch for warning signs of not waking for feeds or being sleepy at the breast, having short feedings and monitor voids/stools which will be an indicator to start supplementing infant.  Recommend feeding on demand at least q 3 hours.   Provided mother with information on small infants, milk storage guidelines, volume supplementation guidelines.   Maternal Data Does the patient have breastfeeding experience prior to this delivery?: Yes  Feeding Mother's Current Feeding Choice: Breast Milk Interventions Interventions: Education;LC Services brochure Consult Status Consult Status: Follow-up Date: 09/05/22 Follow-up type: In-patient    Hardie Pulley  RN [facility] 09/04/2022, 9:23 AM

## 2022-09-05 LAB — RUBELLA SCREEN: Rubella: 7.03 index (ref >0.99)

## 2022-09-05 LAB — RPR: RPR Ser Ql: NONREACTIVE — AB

## 2022-09-05 MED ORDER — BUPRENORPHINE HCL-NALOXONE HCL 2-0.5 MG SL SUBL
0.5000 | SUBLINGUAL_TABLET | Freq: Once | SUBLINGUAL | Status: AC
  Administered 2022-09-05: 0.5 via SUBLINGUAL
  Filled 2022-09-05: qty 1

## 2022-09-05 MED ORDER — BUPRENORPHINE HCL-NALOXONE HCL 2-0.5 MG SL SUBL
1.0000 | SUBLINGUAL_TABLET | Freq: Every day | SUBLINGUAL | Status: DC
  Administered 2022-09-06: 1 via SUBLINGUAL
  Filled 2022-09-05: qty 1

## 2022-09-05 NOTE — Clinical Social Work Maternal (Signed)
CLINICAL SOCIAL WORK MATERNAL/CHILD NOTE  Patient Details  Name: Katherine Mendoza MRN: [id number] Date of Birth: 04/09/1985  Date:  09/05/2022  Clinical Social Worker Initiating Note:  Lawanna Kobus Boyd-Gilyard Date/Time: Initiated:  09/05/22/1329     Child's Name:  At the time of the assessment, MOB was undecided.   Biological Parents:  Mother, Father   Need for Interpreter:  None   Reason for Referral:  Current Substance Use/Substance Use During Pregnancy  , Late or No Prenatal Care     Address:  [address] [place] [address]    Phone number:  [phone] (home)     Additional phone number: FOB's number is [phone]  Household Members/Support Persons (HM/SP):   Household Member/Support Person 1, Household Member/Support Person 2, Household Member/Support Person 3 (MOB's oldest daughter, Ethylene Reznick (02/05/09) lives outside of the home with MOB's parents.)   HM/SP Name Relationship DOB or Age  HM/SP -1 Vira Browns FOB 05/03/1985  HM/SP -2 Temia Debroux son 12/17/17  HM/SP -3 Tonesha Tsou daughter 09/03/20  HM/SP -4        HM/SP -5        HM/SP -6        HM/SP -7        HM/SP -8          Natural Supports (not living in the home):  Parent, Extended Family   Professional Supports: None   Employment: Unemployed   Type of Work:     Education:  Engineer, agricultural   Homebound arranged:    Surveyor, quantity Resources:  Medicaid   Other Resources:  Sales executive  , [employer]   Cultural/Religious Considerations Which May Impact Care:  Per [employer] Sheet, MOB is Non Denominational  Strengths:  Understanding of illness, Home prepared for child  , Compliance with medical plan  , Pediatrician chosen, Ability to meet basic needs  , Psychotropic Medications   Psychotropic Medications:  Suboxone      Pediatrician:    Armed forces operational officer area  Pediatrician List:   [facility]      Pediatrician Fax Number:    Risk Factors/Current Problems:  None   Cognitive State:  Able to Concentrate  , Alert  , Insightful  , Linear Thinking     Mood/Affect:  Comfortable  , Interested  , Calm  , Flat  , Relaxed     CSW Assessment: CSW met with MOB in room 106 to complete an assessment for No PNC and having and active Suboxone Rx.  When CSW arrived, MOB was breastfeeding and 2 room guest were present.  MOB identified her guest as her oldest daughter Meryl Dare) and her father. With MOB's permission CSW asked MOB's guest to leave in order to assessment MOB in private.  CSW explained CSW's role and the need for CSW to complete a clinical assessment. MOB was receptive to meeting with CSW.  MOB was forthcoming and pleasant.   CSW asked about MOB's lack of PNC.  Per MOB, she did not received [facility] because she did not initially have health care coverage. Per MOB coverage was approved late in her pregnancy.  CSW reviewed the hospital's No PNC policy and MOB was understanding.  CSW made MOB aware of drug screens for infant.  CSW communicated that infant's UDS is negative and CSW will continue to monitor  infant's CDS and will make a report to [facility] CPS if warrant.  MOB acknowledged CPS hx 1 year ago and communicated that her case is currently closed.  Per MOB, CPS became involved due to MOB's toddler ingesting a Tylenol that had fallen on the floor. MOB denied the use of all illicit substances however acknowledged having an active Rx for Suboxone.  Per MOB, her medication is management by Dr. Orson Slick in [place].   MOB reports having all essential items for infant and feeling prepared to care for infant post discharge.  MOB communicated having a support team and she expressed feeling comfortable seeking help if needed.   There are no barriers to discharge.   CSW Plan/Description:  No Further Intervention Required/No Barriers to Discharge, Sudden Infant  Death Syndrome (SIDS) Education, Perinatal Mood and Anxiety Disorder (PMADs) Education, Neonatal Abstinence Syndrome (NAS) Education, Other Patient/Family Education, Hospital Drug Screen Policy Information, Other Information/Referral to [facility], CSW Will Continue to Monitor Umbilical Cord Tissue Drug Screen Results and Make Report if Warranted   Blaine Hamper, MSW, LCSW Clinical Social Work [phone]   Barbara Cower, LCSW 09/05/2022, 1:33 PM

## 2022-09-05 NOTE — Lactation Note (Addendum)
This note was copied from a baby's chart. Lactation Consultation Note  Patient Name: Katherine Mendoza [id number] Date: 09/05/2022 Age:37 hours Reason for consult: Follow-up assessment;Infant < 6lbs;Early term 37-38.6wks;Other (Comment) (Maternal SUD on Subutex)  Eat, Sleep and Console  LC in to visit with P4 Mom.  Baby is 5 lb baby and is at a 1% weight loss, stable from yesterday.  Mom is exclusively breastfeeding.  Baby's output is good, bilirubin level low.  LC asked for Mom to latch baby for [facility] assist/assessment.   Mom's breasts are full, firm to touch, but not engorged yet. Baby easily latched, LC guided Mom to support baby's head from ear to ear and support her breast to facilitate baby staying latched deeply.   Mom has erect nipples and compressible areola.  No nipple trauma noted and nipple not misshapen when baby popped off. LC changed diaper, stool green and liquid.  RN aware.  Mom to keep baby STS as much as possible, and offer the breast with feeding cues, goal of 8 feeding or more per 24 hrs.  Engorgement prevention and treatment discussed.  Mom does not wish to pump, but LC talked about pumping if breasts become hard to touch and baby can not latch and soften them after a feeding.  Mom to ask for a hand pump and ice if this happens.  Mom will pump for comfort to soften breast and then bring baby to breast.   LATCH Score Latch: Grasps breast easily, tongue down, lips flanged, rhythmical sucking.  Audible Swallowing: Spontaneous and intermittent  Type of Nipple: Everted at rest and after stimulation  Comfort (Breast/Nipple): Filling, red/small blisters or bruises, mild/mod discomfort (breasts filling)  Hold (Positioning): Assistance needed to correctly position infant at breast and maintain latch. (pillow support, head support and breast support)  LATCH Score: 8  Interventions Interventions: Assisted with latch;Skin to skin;Breast massage;Hand express;Breast  compression;Adjust position;Support pillows;Position options;Expressed milk;Education  Discharge Discharge Education: Engorgement and breast care  Consult Status Consult Status: Follow-up Date: 09/06/22 Follow-up type: In-patient    [name] 09/05/2022, 10:45 AM

## 2022-09-05 NOTE — Progress Notes (Signed)
OB/GYN Faculty Attending Note  Post Partum Day 2  Subjective: Patient is feeling okay. She reports moderately well controlled pain on PO pain meds. She is on suboxone and was on half tab at home, does not feel it is enough here and she is having some symptoms. She is ambulating and denies light-headedness or dizziness. She is passing flatus. She is tolerating a regular diet without nausea/vomiting. Bleeding is moderate. She is breast feeding. Baby is in room and doing well.  Objective: Blood pressure 135/87, pulse 92, temperature 98.6 F (37 C), temperature source Oral, resp. rate 18, height 5\' 6"  (1.676 m), weight 65.8 kg, SpO2 99%, unknown if currently breastfeeding. Temp:  [98 F (36.7 C)-99 F (37.2 C)] 98.6 F (37 C) (07/20 1555) Pulse Rate:  [68-92] 92 (07/20 1555) Resp:  [16-20] 18 (07/20 1555) BP: (99-135)/(63-87) 135/87 (07/20 1555) SpO2:  [94 %-100 %] 99 % (07/20 1555)  Physical Exam:  General: alert, oriented, cooperative Chest: normal respiratory effort Heart: RRR  Abdomen: soft, appropriately tender to palpation  Uterine Fundus: firm, 2 fingers below the umbilicus Lochia: moderate, rubra DVT Evaluation: no evidence of DVT Extremities: no edema, no calf tenderness  UOP: voiding spontaneously  Recent Labs    09/03/22 1640  HGB 11.2*  HCT 34.0*    Assessment/Plan: Patient Active Problem List   Diagnosis Date Noted   Indication for care in labor or delivery 09/03/2022   Preeclampsia 09/03/2022   No prenatal care in current pregnancy 09/03/2022   Vaginal delivery 09/03/2022   Precipitous delivery 09/03/2022    Patient is 37 y.o. [id number] PPD#2 s/p SVD at [redacted]w[redacted]d. Course complicated by severe pre-eclampsia, s/p mag, BP well controlled on procardia and lasix. She is doing well, recovering appropriately and complains only of some withdrawal symptoms. Will increase suboxone to 1 tab and if she is feeling well, plan for discharge home tomorrow.   Continue routine  post partum care Pain meds prn Regular diet declines for birth control Plan for discharge tomorrow    K. Therese Sarah, MD, [facility] Attending Center for [employer] (Faculty Practice)  09/05/2022, 4:46 PM

## 2022-09-06 ENCOUNTER — Ambulatory Visit (HOSPITAL_COMMUNITY): Payer: Self-pay

## 2022-09-06 ENCOUNTER — Other Ambulatory Visit: Payer: Self-pay

## 2022-09-06 ENCOUNTER — Encounter (HOSPITAL_COMMUNITY): Payer: Self-pay | Admitting: Obstetrics & Gynecology

## 2022-09-06 LAB — RAPID URINE DRUG SCREEN, HOSP PERFORMED
Amphetamines: NOT DETECTED
Barbiturates: NOT DETECTED
Benzodiazepines: NOT DETECTED
Cocaine: NOT DETECTED
Opiates: NOT DETECTED
Tetrahydrocannabinol: NOT DETECTED

## 2022-09-06 MED ORDER — NIFEDIPINE ER OSMOTIC RELEASE 30 MG PO TB24
ORAL_TABLET | ORAL | Status: AC
  Administered 2022-09-06: 30 mg via ORAL
  Filled 2022-09-06: qty 1

## 2022-09-06 MED ORDER — IBUPROFEN 600 MG PO TABS: 600.0000 mg | ORAL_TABLET | Freq: Four times a day (QID) | ORAL | 1 refills | Status: AC | PRN

## 2022-09-06 MED ORDER — NIFEDIPINE ER 60 MG PO TB24: 60.0000 mg | ORAL_TABLET | Freq: Two times a day (BID) | ORAL | 1 refills | Status: AC

## 2022-09-06 MED ORDER — NIFEDIPINE ER OSMOTIC RELEASE 30 MG PO TB24: 30.0000 mg | ORAL_TABLET | Freq: Once | ORAL | Status: AC

## 2022-09-06 MED ORDER — FUROSEMIDE 20 MG PO TABS: 20.0000 mg | ORAL_TABLET | Freq: Every day | ORAL | 0 refills | Status: AC

## 2022-09-06 MED ORDER — NIFEDIPINE ER OSMOTIC RELEASE 60 MG PO TB24: 60.0000 mg | ORAL_TABLET | Freq: Two times a day (BID) | ORAL | Status: DC

## 2022-09-06 MED ORDER — BUPRENORPHINE HCL-NALOXONE HCL 2-0.5 MG SL SUBL: 1.0000 | SUBLINGUAL_TABLET | Freq: Every day | SUBLINGUAL | 0 refills | Status: AC

## 2022-09-06 NOTE — Lactation Note (Signed)
This note was copied from a baby's chart. Lactation Consultation Note  Patient Name: Katherine Mendoza [id number] Date: 09/06/2022 Age:37 hours Reason for consult: Follow-up assessment;Breastfeeding assistance;Mother's request;Early term 37-38.6wks;Infant < 6lbs;Other (Comment) (NAS)   LC in to visit with Mom ET infant.  Baby's weight as remained stable at 1% weight loss.  Mom is exclusively breastfeeding baby, but did pump twice due to breasts being so full.    Mom also concerned as baby is spitting after some feedings and asked whether she should feeding baby when she acts hungry after she spits.    LC encouraged feeding baby often with cues and to burp baby often.  Baby latched with minimal assistance in cross cradle and football holds.  Baby did spit a large amount of undigested breastmilk, but started cueing right after.  Baby wasn't in any distress, and easily latched again.  Mom concerned that her let down may be too forceful.  Talked about pre-pumping 2-5 mins until milk flow slows before latching baby.   Baby looked nutritive, coordinated and vigorous at the breast. Mom to continue pumping for comfort, to soften breasts, if breasts are full, but not to "empty" breasts as she could create an over supply.    LATCH Score Latch: Grasps breast easily, tongue down, lips flanged, rhythmical sucking.  Audible Swallowing: Spontaneous and intermittent  Type of Nipple: Everted at rest and after stimulation  Comfort (Breast/Nipple): Soft / non-tender (breasts full, not engorged)  Hold (Positioning): Assistance needed to correctly position infant at breast and maintain latch.  LATCH Score: 9   Lactation Tools Discussed/Used Tools: Pump Flange Size: 21 Breast pump type: Double-Electric Breast Pump;Manual Pump Education: Setup, frequency, and cleaning;Milk Storage Reason for Pumping: Mother's request for fullness Pumping frequency: PRN  Interventions Interventions: Breast  feeding basics reviewed;Assisted with latch;Skin to skin;Breast massage;Hand express;Adjust position;Support pillows;Position options;DEBP;Education  Discharge Discharge Education: Engorgement and breast care Pump: Manual  Consult Status Consult Status: Follow-up Date: 09/07/22 Follow-up type: In-patient    [name] 09/06/2022, 12:33 PM

## 2022-09-06 NOTE — Plan of Care (Signed)
  Problem: Education: Goal: Knowledge of General Education information will improve Description: Including pain rating scale, medication(s)/side effects and non-pharmacologic comfort measures Outcome: Completed/Met   Problem: Health Behavior/Discharge Planning: Goal: Ability to manage health-related needs will improve Outcome: Completed/Met   Problem: Clinical Measurements: Goal: Ability to maintain clinical measurements within normal limits will improve Outcome: Completed/Met Goal: Will remain free from infection Outcome: Completed/Met Goal: Diagnostic test results will improve Outcome: Completed/Met Goal: Respiratory complications will improve Outcome: Completed/Met Goal: Cardiovascular complication will be avoided Outcome: Completed/Met   Problem: Activity: Goal: Risk for activity intolerance will decrease Outcome: Completed/Met   Problem: Nutrition: Goal: Adequate nutrition will be maintained Outcome: Completed/Met   Problem: Coping: Goal: Level of anxiety will decrease Outcome: Completed/Met   Problem: Elimination: Goal: Will not experience complications related to bowel motility Outcome: Completed/Met Goal: Will not experience complications related to urinary retention Outcome: Completed/Met   Problem: Pain Managment: Goal: General experience of comfort will improve Outcome: Completed/Met   Problem: Safety: Goal: Ability to remain free from injury will improve Outcome: Completed/Met   Problem: Skin Integrity: Goal: Risk for impaired skin integrity will decrease Outcome: Completed/Met   Problem: Education: Goal: Knowledge of disease or condition will improve Outcome: Completed/Met Goal: Knowledge of the prescribed therapeutic regimen will improve Outcome: Completed/Met   Problem: Fluid Volume: Goal: Peripheral tissue perfusion will improve Outcome: Completed/Met   Problem: Clinical Measurements: Goal: Complications related to disease process,  condition or treatment will be avoided or minimized Outcome: Completed/Met   Problem: Education: Goal: Knowledge of Childbirth will improve Outcome: Completed/Met Goal: Ability to make informed decisions regarding treatment and plan of care will improve Outcome: Completed/Met Goal: Ability to state and carry out methods to decrease the pain will improve Outcome: Completed/Met Goal: Individualized Educational Video(s) Outcome: Completed/Met   Problem: Coping: Goal: Ability to verbalize concerns and feelings about labor and delivery will improve Outcome: Completed/Met   Problem: Life Cycle: Goal: Ability to make normal progression through stages of labor will improve Outcome: Completed/Met Goal: Ability to effectively push during vaginal delivery will improve Outcome: Completed/Met   Problem: Role Relationship: Goal: Will demonstrate positive interactions with the child Outcome: Completed/Met   Problem: Safety: Goal: Risk of complications during labor and delivery will decrease Outcome: Completed/Met   Problem: Pain Management: Goal: Relief or control of pain from uterine contractions will improve Outcome: Completed/Met   Problem: Education: Goal: Knowledge of condition will improve Outcome: Completed/Met Goal: Individualized Educational Video(s) Outcome: Completed/Met Goal: Individualized Newborn Educational Video(s) Outcome: Completed/Met   Problem: Activity: Goal: Will verbalize the importance of balancing activity with adequate rest periods Outcome: Completed/Met Goal: Ability to tolerate increased activity will improve Outcome: Completed/Met   Problem: Coping: Goal: Ability to identify and utilize available resources and services will improve Outcome: Completed/Met   Problem: Life Cycle: Goal: Chance of risk for complications during the postpartum period will decrease Outcome: Completed/Met   Problem: Role Relationship: Goal: Ability to demonstrate  positive interaction with newborn will improve Outcome: Completed/Met   Problem: Skin Integrity: Goal: Demonstration of wound healing without infection will improve Outcome: Completed/Met

## 2022-09-07 ENCOUNTER — Ambulatory Visit (HOSPITAL_COMMUNITY): Payer: Self-pay

## 2022-09-07 NOTE — Lactation Note (Signed)
This note was copied from a baby's chart. Lactation Consultation Note  Patient Name: Katherine Mendoza [id number] Date: 09/07/2022 Age:37 days Reason for consult: Follow-up assessment;Other (Comment);Early term 83-38.6wks;Infant < 6lbs (NAS, AMA)  Visited with family of 40 hours old ETI female; Ms. Coventry is a P4 and experienced breastfeeding. She has a Hx of oversupply and already pumping large amounts. Even though she's only pumping for comfort her output is already ANL. She continues taking baby "Katherine Mendoza" to breast consistently, she's exclusively breastfeeding. She voiced that baby is still spitting up, not sure if it's because of her large supply or part of NAS or possibly a combination of both. Noticed that her breast are full with hard/non compressible knots on the bottom of both breasts. Provided ice packs, and encouraged her to continue pumping just for comfort in addition of putting baby to breast +8 times/24 hours or sooner if feeding cues are present. Baby is a baby patient and Ms. Blacksher is anticipated to getting her discharge today. Reviewed discharge education, lactogenesis II, guidelines for milk supply, anticipatory guidelines and LC OP F/U; she'll be reaching out if she needs a referral or to schedule an appt with Renee Rival. All questions and concerns answered, family to contact [facility] services PRN.  Feeding Mother's Current Feeding Choice: Breast Milk  Lactation Tools Discussed/Used Tools: Pump;Flanges Flange Size: 21 Breast pump type: Double-Electric Breast Pump Pump Education: Setup, frequency, and cleaning;Milk Storage Reason for Pumping: ETI, pumping for comfort Pumping frequency: PRN Pumped volume: 90 mL (90-110 ml)  Interventions Interventions: Breast feeding basics reviewed;DEBP;Education;Ice  Discharge Discharge Education: Engorgement and breast care;Warning signs for feeding baby;Outpatient recommendation Pump: Personal;Manual (She has a DEBP in her storage  unit, she's also taking a hand pump home)  Consult Status Consult Status: Complete Date: 09/07/22 Follow-up type: Call as needed   Marylin Lathon Venetia Constable 09/07/2022, 12:31 PM

## 2022-09-08 LAB — SURGICAL PATHOLOGY

## 2022-09-10 ENCOUNTER — Ambulatory Visit (INDEPENDENT_AMBULATORY_CARE_PROVIDER_SITE_OTHER): Payer: Medicaid Other | Admitting: *Deleted

## 2022-09-10 VITALS — BP 141/90 | HR 100

## 2022-09-10 DIAGNOSIS — Z0130 Encounter for examination of blood pressure without abnormal findings: Secondary | ICD-10-CM

## 2022-09-10 NOTE — Progress Notes (Signed)
   NURSE VISIT- BLOOD PRESSURE CHECK  SUBJECTIVE:  Katherine Mendoza is a 37 y.o. [phone] female here for BP check. She is postpartum, delivery date 09/03/22     HYPERTENSION ROS:  Pregnant/postpartum:  Severe headaches that don't go away with tylenol/other medicines: No  Visual changes (seeing spots/double/blurred vision) No  Severe pain under right breast breast or in center of upper chest No  Severe nausea/vomiting No  Taking medicines as instructed yes    OBJECTIVE:  There were no vitals taken for this visit.  Appearance alert, well appearing, and in no distress.  ASSESSMENT: Postpartum  blood pressure check  PLAN: Discussed with Dr. Charlotta Newton   Recommendations:  Dr. Charlotta Newton spoke with patient    Follow-up:  1 week    Annamarie Dawley  09/10/2022 2:59 PM

## 2022-10-15 ENCOUNTER — Ambulatory Visit: Payer: Medicaid Other | Admitting: Advanced Practice Midwife

## 2022-10-15 ENCOUNTER — Ambulatory Visit: Payer: Medicaid Other | Admitting: Women's Health
